# Patient Record
Sex: Male | Born: 1960
Health system: Southern US, Community
[De-identification: ages and names within clinical notes are randomized; demographics above are authoritative.]

## PROBLEM LIST (undated history)

## (undated) DIAGNOSIS — R011 Cardiac murmur, unspecified: Secondary | ICD-10-CM

## (undated) HISTORY — DX: Cardiac murmur, unspecified: R01.1

---

## 2002-10-07 HISTORY — PX: KIDNEY SURGERY: SHX687

## 2003-10-08 HISTORY — PX: AV FISTULA PLACEMENT: SHX1204

## 2003-10-08 HISTORY — PX: HEMORRHOID SURGERY: SHX153

## 2012-09-14 DIAGNOSIS — N509 Disorder of male genital organs, unspecified: Secondary | ICD-10-CM | POA: Insufficient documentation

## 2012-09-14 DIAGNOSIS — N400 Enlarged prostate without lower urinary tract symptoms: Secondary | ICD-10-CM | POA: Insufficient documentation

## 2012-09-14 DIAGNOSIS — N401 Enlarged prostate with lower urinary tract symptoms: Secondary | ICD-10-CM | POA: Insufficient documentation

## 2013-04-20 ENCOUNTER — Encounter: Payer: Self-pay | Admitting: *Deleted

## 2013-07-12 ENCOUNTER — Ambulatory Visit: Payer: Self-pay | Admitting: Gastroenterology

## 2014-01-21 ENCOUNTER — Ambulatory Visit: Payer: Self-pay | Admitting: Gastroenterology

## 2014-01-24 LAB — PATHOLOGY REPORT

## 2014-07-07 IMAGING — RF DG BARIUM SWALLOW
10 of 11 series · 15 of 18 positions shown · non-contrast
Comparison: none

REASON FOR EXAM: dysphagia
COMMENTS:

PROCEDURE:     FL  - FL BARIUM SWALLOW  - July 12, 2013 [DATE]
RESULT:     History: Dysphagia.
Comparison Study: No prior.

[Series 1: fluoro_barium 2fps_bw · 0.19mm/px · 3 of 12 frames shown (1 of 10)]
[frame 2/12]
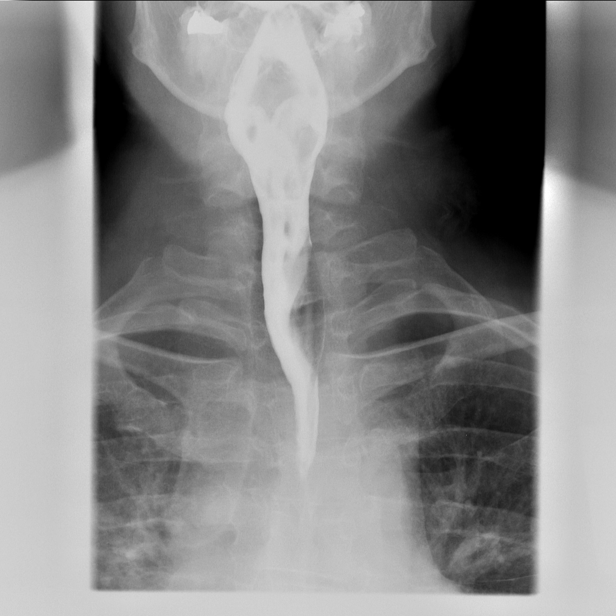
[frame 7/12]
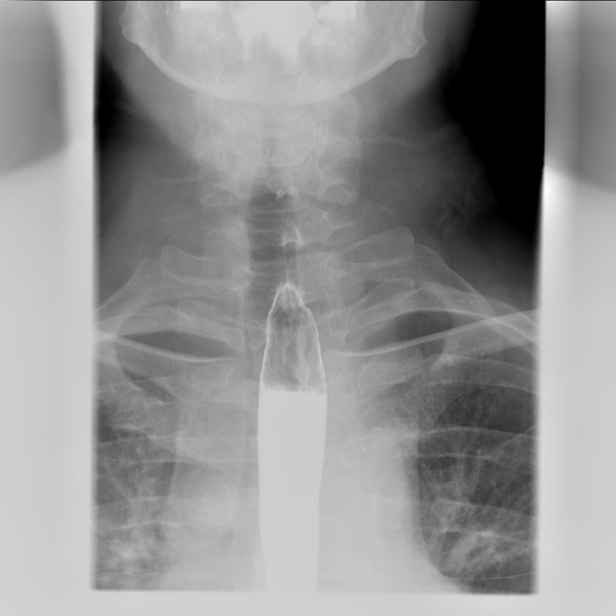
[frame 11/12]
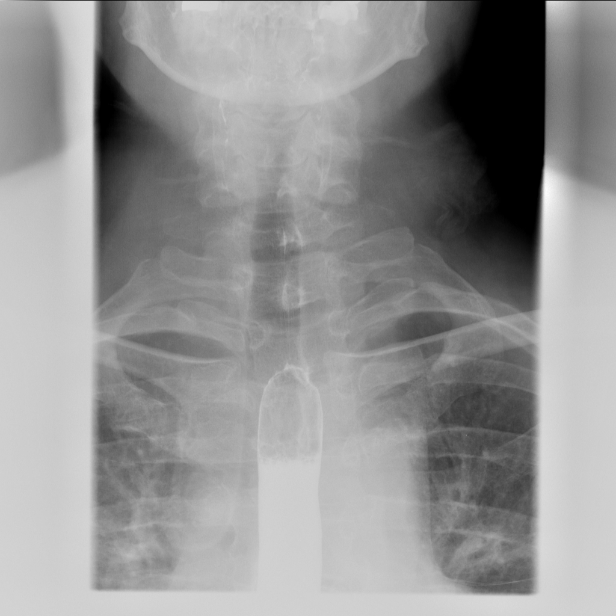

[Series 2: fluoro_barium 2fps_bw · 0.19mm/px · 3 of 7 frames shown (2 of 10)]
[frame 2/7]
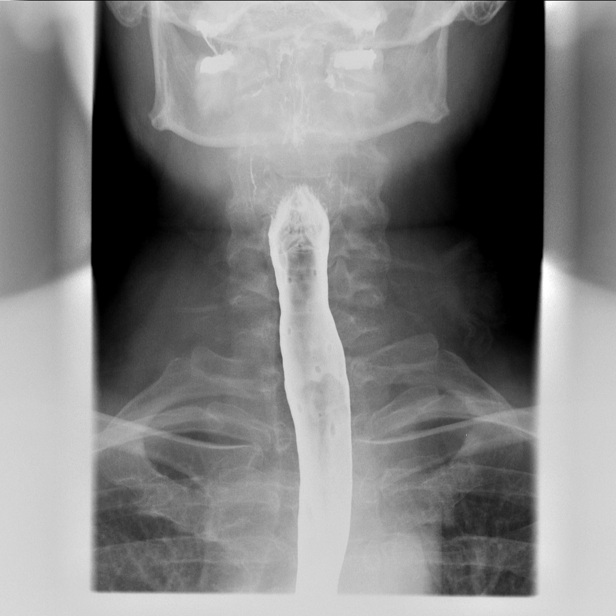
[frame 4/7]
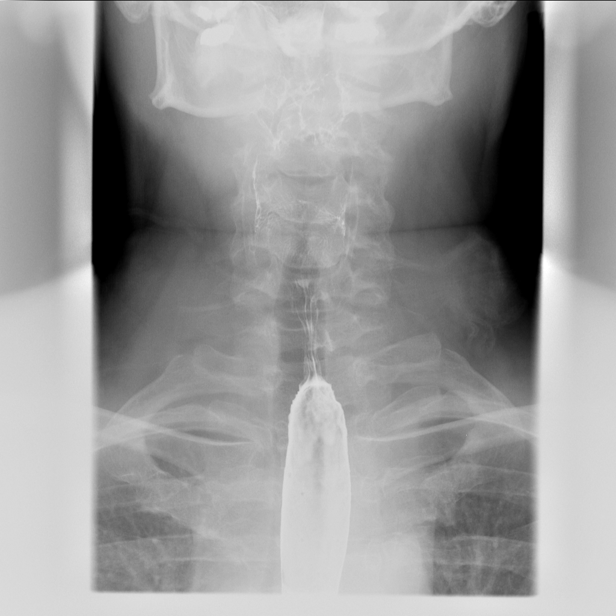
[frame 6/7]
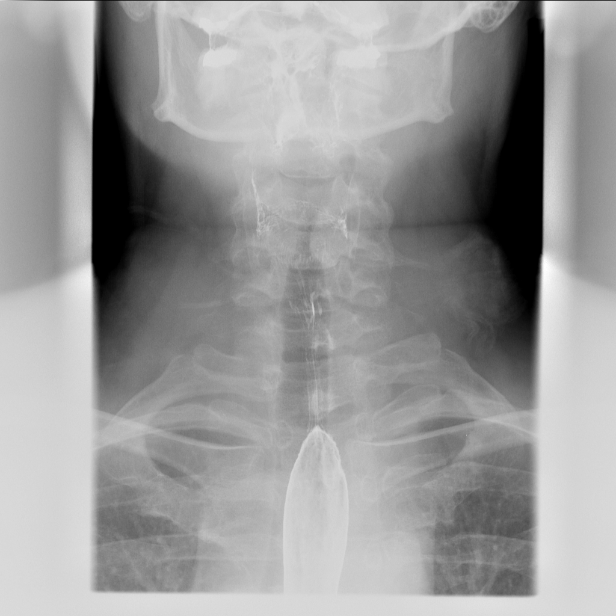

[Series 3: fluoro_barium 2fps_bw · 0.19mm/px · 1 of 1 slices shown (3 of 10)]
[im 1/1]
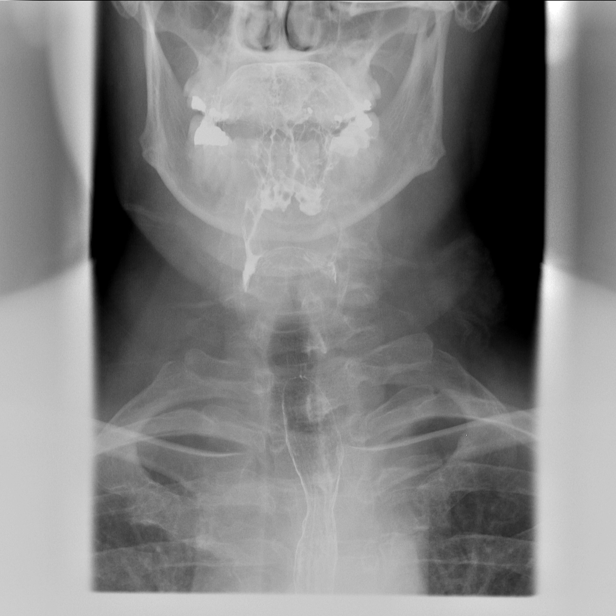

[Series 4: fluoro_barium 2fps_bw · 0.19mm/px · 2 of 8 frames shown (4 of 10)]
[frame 5/8]
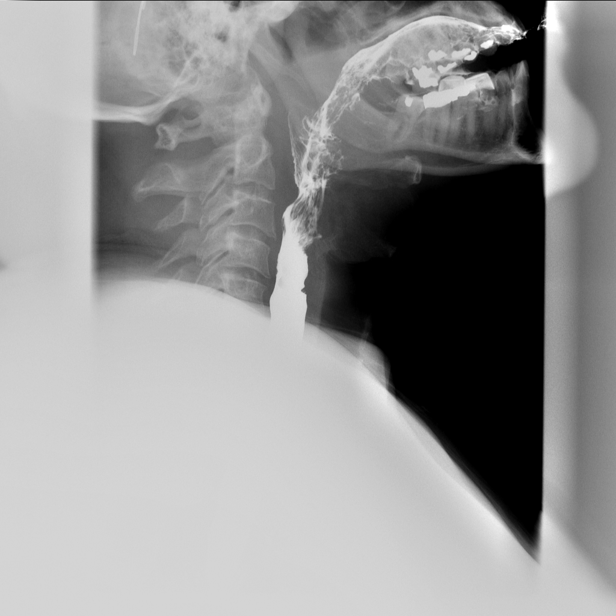
[frame 7/8]
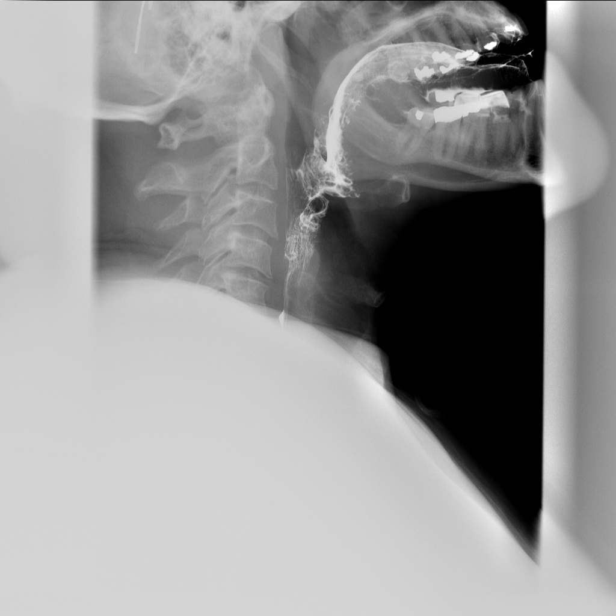

[Series 5: fluoro_barium 2fps_bw · 0.19mm/px · 1 of 1 slices shown (5 of 10)]
[im 1/1]
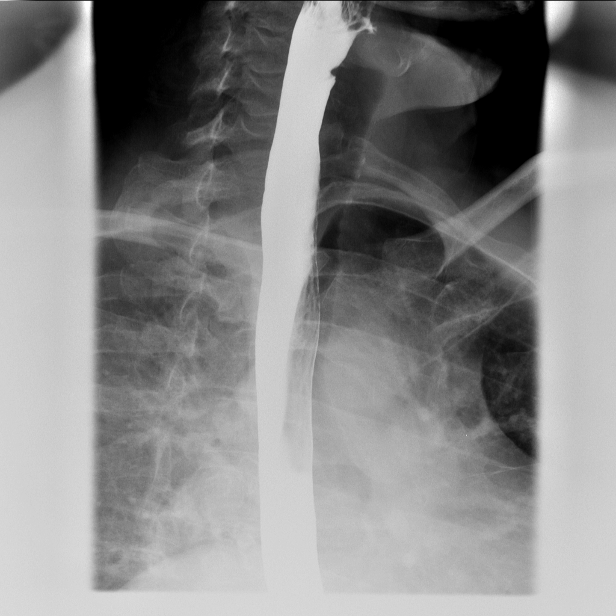

[Series 6: fluoro_barium 2fps_bw · 0.19mm/px · 1 of 1 slices shown (6 of 10)]
[im 1/1]
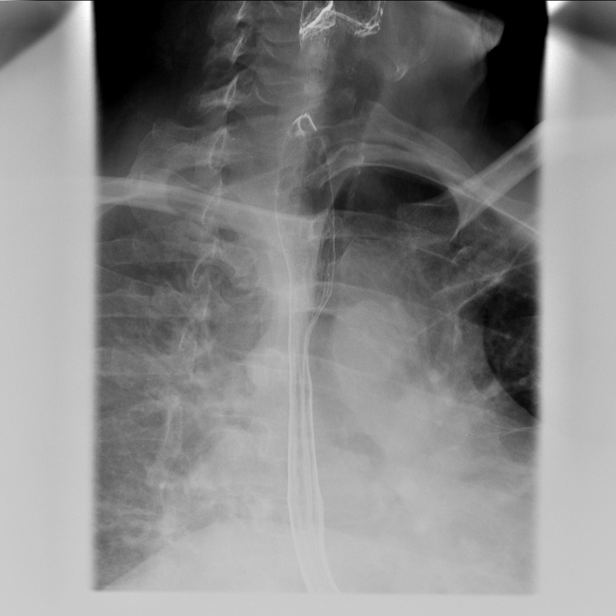

[Series 7: fluoro_barium 2fps_bw · 0.19mm/px · 1 of 1 slices shown (7 of 10)]
[im 1/1]
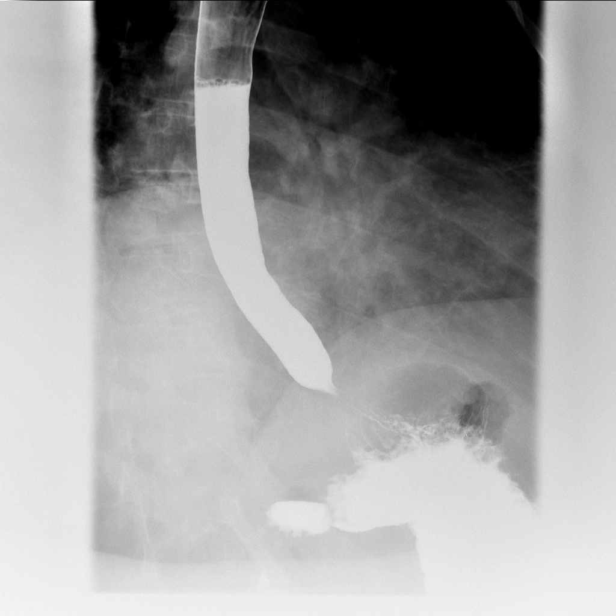

[Series 9: fluoro_barium 2fps_bw · 0.19mm/px · 1 of 1 slices shown (8 of 10)]
[im 1/1]
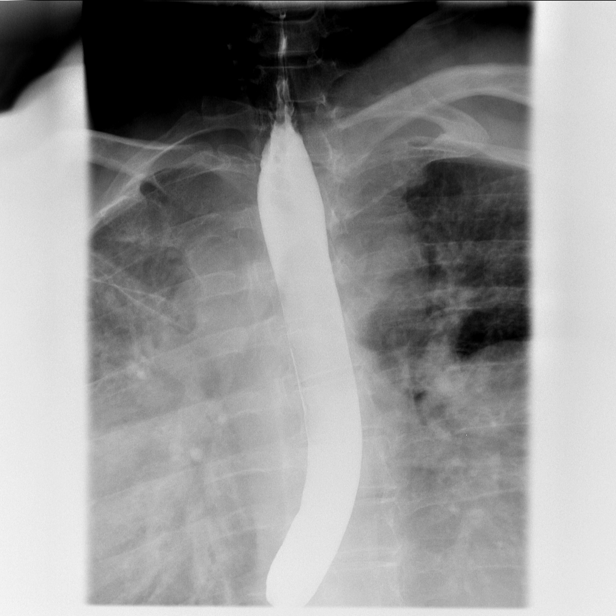

[Series 10: fluoro_barium 2fps_bw · 0.19mm/px · 1 of 1 slices shown (9 of 10)]
[im 1/1]
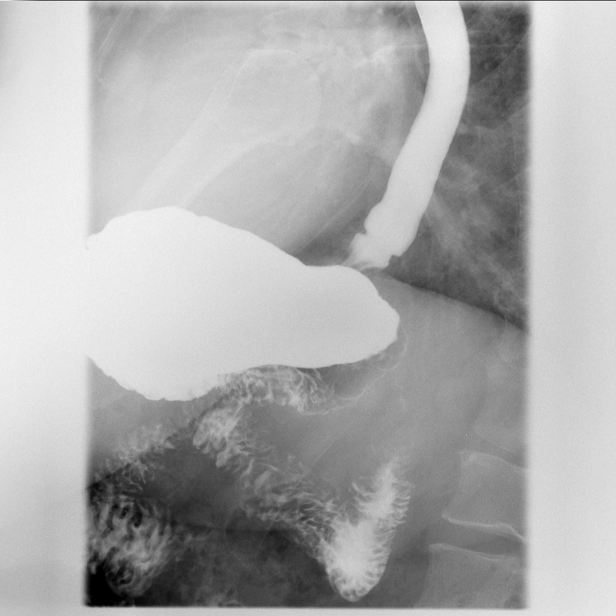

[Series 11: fluoro_barium 2fps_bw · 0.19mm/px · 1 of 1 slices shown (10 of 10)]
[im 1/1]
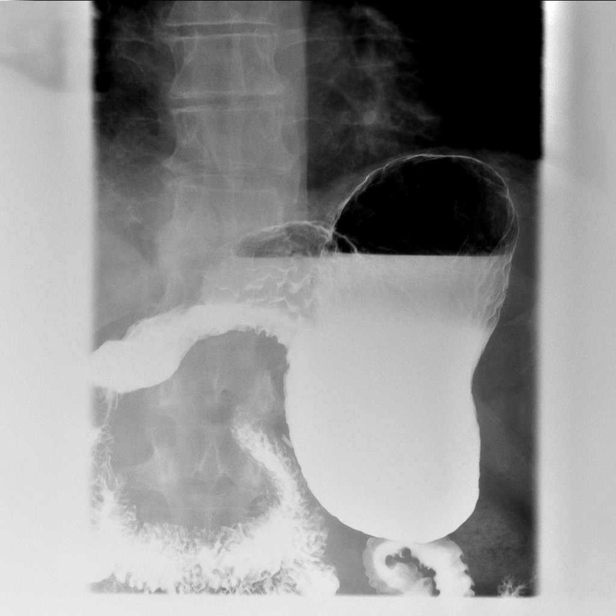

[15 of 18 positions shown; findings below may reference images not displayed]

FINDINGS: The cervical and esophagus is widely patent. Tiny hiatal hernia
with a small B ring is noted. There is no evidence of significant
stenosis.Standardized barium tablet passed into the stomach without
difficulty. No significant reflux.
IMPRESSION: Tiny hiatal hernia. Otherwise normal exam. No evidence of
significant stricture.

## 2015-01-31 ENCOUNTER — Ambulatory Visit: Payer: Self-pay | Admitting: General Surgery

## 2015-11-22 DIAGNOSIS — R972 Elevated prostate specific antigen [PSA]: Secondary | ICD-10-CM | POA: Insufficient documentation

## 2016-09-23 DIAGNOSIS — J329 Chronic sinusitis, unspecified: Secondary | ICD-10-CM | POA: Insufficient documentation

## 2016-09-23 DIAGNOSIS — J069 Acute upper respiratory infection, unspecified: Secondary | ICD-10-CM | POA: Insufficient documentation

## 2016-09-23 DIAGNOSIS — H6993 Unspecified Eustachian tube disorder, bilateral: Secondary | ICD-10-CM | POA: Insufficient documentation

## 2016-09-23 DIAGNOSIS — H6983 Other specified disorders of Eustachian tube, bilateral: Secondary | ICD-10-CM | POA: Insufficient documentation

## 2016-10-21 DIAGNOSIS — N289 Disorder of kidney and ureter, unspecified: Secondary | ICD-10-CM | POA: Diagnosis not present

## 2016-12-21 DIAGNOSIS — M25521 Pain in right elbow: Secondary | ICD-10-CM | POA: Diagnosis not present

## 2017-02-03 DIAGNOSIS — H6983 Other specified disorders of Eustachian tube, bilateral: Secondary | ICD-10-CM | POA: Diagnosis not present

## 2017-02-03 DIAGNOSIS — J329 Chronic sinusitis, unspecified: Secondary | ICD-10-CM | POA: Diagnosis not present

## 2017-02-11 DIAGNOSIS — J320 Chronic maxillary sinusitis: Secondary | ICD-10-CM | POA: Diagnosis not present

## 2017-02-11 DIAGNOSIS — J329 Chronic sinusitis, unspecified: Secondary | ICD-10-CM | POA: Diagnosis not present

## 2017-02-27 DIAGNOSIS — R972 Elevated prostate specific antigen [PSA]: Secondary | ICD-10-CM | POA: Diagnosis not present

## 2017-03-07 DIAGNOSIS — R972 Elevated prostate specific antigen [PSA]: Secondary | ICD-10-CM | POA: Diagnosis not present

## 2017-03-07 DIAGNOSIS — N4340 Spermatocele of epididymis, unspecified: Secondary | ICD-10-CM | POA: Diagnosis not present

## 2017-03-07 DIAGNOSIS — N289 Disorder of kidney and ureter, unspecified: Secondary | ICD-10-CM | POA: Diagnosis not present

## 2017-03-07 DIAGNOSIS — N401 Enlarged prostate with lower urinary tract symptoms: Secondary | ICD-10-CM | POA: Diagnosis not present

## 2017-03-12 DIAGNOSIS — R030 Elevated blood-pressure reading, without diagnosis of hypertension: Secondary | ICD-10-CM | POA: Diagnosis not present

## 2017-03-12 DIAGNOSIS — J324 Chronic pansinusitis: Secondary | ICD-10-CM | POA: Diagnosis not present

## 2017-03-12 DIAGNOSIS — N289 Disorder of kidney and ureter, unspecified: Secondary | ICD-10-CM | POA: Diagnosis not present

## 2017-03-12 DIAGNOSIS — Z0000 Encounter for general adult medical examination without abnormal findings: Secondary | ICD-10-CM | POA: Diagnosis not present

## 2017-04-03 DIAGNOSIS — J329 Chronic sinusitis, unspecified: Secondary | ICD-10-CM | POA: Diagnosis not present

## 2017-04-22 DIAGNOSIS — D2271 Melanocytic nevi of right lower limb, including hip: Secondary | ICD-10-CM | POA: Diagnosis not present

## 2017-04-22 DIAGNOSIS — L570 Actinic keratosis: Secondary | ICD-10-CM | POA: Diagnosis not present

## 2017-05-27 DIAGNOSIS — J329 Chronic sinusitis, unspecified: Secondary | ICD-10-CM | POA: Diagnosis not present

## 2017-07-28 DIAGNOSIS — Z23 Encounter for immunization: Secondary | ICD-10-CM | POA: Diagnosis not present

## 2017-09-01 DIAGNOSIS — R972 Elevated prostate specific antigen [PSA]: Secondary | ICD-10-CM | POA: Diagnosis not present

## 2017-09-25 DIAGNOSIS — N401 Enlarged prostate with lower urinary tract symptoms: Secondary | ICD-10-CM | POA: Diagnosis not present

## 2017-09-25 DIAGNOSIS — R972 Elevated prostate specific antigen [PSA]: Secondary | ICD-10-CM | POA: Diagnosis not present

## 2017-09-25 DIAGNOSIS — N4340 Spermatocele of epididymis, unspecified: Secondary | ICD-10-CM | POA: Diagnosis not present

## 2017-09-25 DIAGNOSIS — R339 Retention of urine, unspecified: Secondary | ICD-10-CM | POA: Diagnosis not present

## 2017-09-30 DIAGNOSIS — R339 Retention of urine, unspecified: Secondary | ICD-10-CM | POA: Insufficient documentation

## 2017-11-10 DIAGNOSIS — N289 Disorder of kidney and ureter, unspecified: Secondary | ICD-10-CM | POA: Diagnosis not present

## 2018-03-03 DIAGNOSIS — Z7689 Persons encountering health services in other specified circumstances: Secondary | ICD-10-CM | POA: Diagnosis not present

## 2018-03-13 DIAGNOSIS — Z0000 Encounter for general adult medical examination without abnormal findings: Secondary | ICD-10-CM | POA: Diagnosis not present

## 2018-03-19 ENCOUNTER — Ambulatory Visit (INDEPENDENT_AMBULATORY_CARE_PROVIDER_SITE_OTHER): Payer: 59 | Admitting: Urology

## 2018-03-19 ENCOUNTER — Encounter: Payer: Self-pay | Admitting: Urology

## 2018-03-19 VITALS — BP 131/84 | HR 82 | Resp 16 | Ht 67.0 in | Wt 245.0 lb

## 2018-03-19 DIAGNOSIS — N4340 Spermatocele of epididymis, unspecified: Secondary | ICD-10-CM | POA: Insufficient documentation

## 2018-03-19 DIAGNOSIS — Z87898 Personal history of other specified conditions: Secondary | ICD-10-CM | POA: Diagnosis not present

## 2018-03-19 NOTE — Progress Notes (Signed)
03/19/2018 1:33 PM   Joel Andrews 04/21/1961 [id number]  Referring provider: No referring provider defined for this encounter.  Chief Complaint  Patient presents with  . Elevated PSA    HPI: Joel Andrews is a 57 year old male who presents for transfer of urologic care to [facility].  He has previously seen Dr. Jacqlyn Larsen at [facility] and underwent a prostate biopsy in April 2017 for PSA of 5.6 with benign pathology.  His prostate volume was calculated at 38 cc.  He also had a prostate MRI prior to his biopsy that showed no suspicious lesions.  A PSA performed in late May 2019 at his PCP was 3.2.  He has no bothersome lower urinary tract symptoms.  He does have a right spermatocele and intermittent right hemiscrotal pain.  He denies dysuria or gross hematuria.  Denies flank or abdominal pain.   PMH: Past Medical History:  Diagnosis Date  . Heart murmur     Surgical History: Past Surgical History:  Procedure Laterality Date  . AV FISTULA PLACEMENT  2005  . [place]  2005  . KIDNEY SURGERY  2004    Home Medications:  Allergies as of 03/19/2018   No Known Allergies     Medication List        Accurate as of 03/19/18  1:33 PM. Always use your most recent med list.          Fish Oil 1000 MG Caps Take by mouth.   Flaxseed Oil 1000 MG Caps Take by mouth.   lisinopril 40 MG tablet Commonly known as:  PRINIVIL,ZESTRIL Take by mouth.   omeprazole 40 MG capsule Commonly known as:  PRILOSEC Take by mouth.   vitamin B-12 1000 MCG tablet Commonly known as:  CYANOCOBALAMIN Take by mouth.   VITAMIN D-1000 MAX ST 1000 units tablet Generic drug:  Cholecalciferol Take by mouth.   zinc gluconate 50 MG tablet Take by mouth.       Allergies: No Known Allergies  Family History: Family History  Problem Relation Age of Onset  . Prostate cancer Neg Hx   . Bladder Cancer Neg Hx   . Kidney cancer Neg Hx     Social History:  reports that he has never smoked. He has  never used smokeless tobacco. He reports that he does not drink alcohol or use drugs.  ROS: UROLOGY Frequent Urination?: No Hard to postpone urination?: No Burning/pain with urination?: No Get up at night to urinate?: Yes Leakage of urine?: Yes Urine stream starts and stops?: No Trouble starting stream?: No Do you have to strain to urinate?: No Blood in urine?: No Urinary tract infection?: No Sexually transmitted disease?: No Injury to kidneys or bladder?: No Painful intercourse?: No Weak stream?: No Erection problems?: No Penile pain?: No  Gastrointestinal Nausea?: No Vomiting?: No Indigestion/heartburn?: No Diarrhea?: No Constipation?: No  Constitutional Fever: No Night sweats?: No Weight loss?: No Fatigue?: No  Skin Skin rash/lesions?: No Itching?: No  Eyes Blurred vision?: No Double vision?: No  Ears/Nose/Throat Sore throat?: No Sinus problems?: No  Hematologic/Lymphatic Swollen glands?: No Easy bruising?: No  Cardiovascular Leg swelling?: No Chest pain?: No  Respiratory Cough?: No Shortness of breath?: No  Endocrine Excessive thirst?: No  Musculoskeletal Back pain?: No Joint pain?: No  Neurological Headaches?: No Dizziness?: No  Psychologic Depression?: No Anxiety?: No  Physical Exam: BP 131/84   Pulse 82   Resp 16   Ht 5\' 7"  (1.702 m)   Wt 245 lb (111.1 kg)  SpO2 96%   BMI 38.37 kg/m   Constitutional:  Alert and oriented, No acute distress. HEENT: [place] AT, moist mucus membranes.  Trachea midline, no masses. Cardiovascular: No clubbing, cyanosis, or edema. Respiratory: Normal respiratory effort, no increased work of breathing. GI: Abdomen is soft, nontender, nondistended, no abdominal masses GU: No CVA tenderness.  Penis without lesions.  Testes descended bilateral without masses or tenderness.  Approximately 15 mm spermatocele right globus major.  Prostate 35 g, smooth without nodules. Lymph: No cervical or inguinal  lymphadenopathy. Skin: No rashes, bruises or suspicious lesions. Neurologic: Grossly intact, no focal deficits, moving all 4 extremities. Psychiatric: Normal mood and affect.   Assessment & Plan:   57 year old male with history of elevated PSA and benign prostate biopsy.  A PSA performed in Dr. Reuel Boom office on 5/28 was stable at 3.2.  DRE is benign.  I have recommended a follow-up PSA only in 6 months and PSA/DRE in 1 year.   Return in about 1 year (around 03/20/2019) for Recheck.  Abbie Sons, [place] [address], [place], [place] [address] [phone]

## 2018-04-23 ENCOUNTER — Ambulatory Visit: Payer: Self-pay | Admitting: Urology

## 2018-04-23 DIAGNOSIS — D2262 Melanocytic nevi of left upper limb, including shoulder: Secondary | ICD-10-CM | POA: Diagnosis not present

## 2018-04-23 DIAGNOSIS — D225 Melanocytic nevi of trunk: Secondary | ICD-10-CM | POA: Diagnosis not present

## 2018-04-23 DIAGNOSIS — D2261 Melanocytic nevi of right upper limb, including shoulder: Secondary | ICD-10-CM | POA: Diagnosis not present

## 2018-06-03 DIAGNOSIS — M7702 Medial epicondylitis, left elbow: Secondary | ICD-10-CM | POA: Diagnosis not present

## 2018-09-10 ENCOUNTER — Other Ambulatory Visit: Payer: 59

## 2018-09-14 ENCOUNTER — Other Ambulatory Visit: Payer: 59

## 2018-09-17 ENCOUNTER — Ambulatory Visit: Payer: 59 | Admitting: Urology

## 2018-09-18 ENCOUNTER — Other Ambulatory Visit: Payer: Self-pay | Admitting: Family Medicine

## 2018-09-18 ENCOUNTER — Other Ambulatory Visit: Payer: 59

## 2018-09-18 ENCOUNTER — Ambulatory Visit: Payer: 59 | Admitting: Urology

## 2018-09-18 DIAGNOSIS — Z87898 Personal history of other specified conditions: Secondary | ICD-10-CM | POA: Diagnosis not present

## 2018-09-19 LAB — PSA: Prostate Specific Ag, Serum: 3.4 ng/mL (ref 0.0–4.0)

## 2018-09-21 ENCOUNTER — Ambulatory Visit (INDEPENDENT_AMBULATORY_CARE_PROVIDER_SITE_OTHER): Payer: 59 | Admitting: Urology

## 2018-09-21 ENCOUNTER — Encounter: Payer: Self-pay | Admitting: Urology

## 2018-09-21 VITALS — BP 145/89 | HR 63 | Ht 67.0 in | Wt 250.3 lb

## 2018-09-21 DIAGNOSIS — Z87898 Personal history of other specified conditions: Secondary | ICD-10-CM

## 2018-09-21 NOTE — Progress Notes (Signed)
09/21/2018  8:27 AM   Joel Andrews July 06, 1961 [id number]  Referring provider: Juluis Pitch, MD [phone] [address] [place], [place] [address]  Chief Complaint  Patient presents with  . Follow-up  . Elevated PSA   Urologic History 1. Elevated PSA   -Prostate Biopsy (01/2016); PSA 5.6; 38 cc prostate volume; benign  - MRI (06/04/2016) showed no suspicious lesions   HPI: Joel Andrews is a 57 y.o. male that present today for his 6 month PSA follow-up. Previously seen by Dr. Jacqlyn Larsen at [facility].   - Denies bothersome lower urinary tract symptoms  - Denies dysuria or gross hematuria.  - Denies flank or abdominal pain.  - Recent PSA: 3.4 (09/18/2018)  PMH: Past Medical History:  Diagnosis Date  . Heart murmur    Surgical History: Past Surgical History:  Procedure Laterality Date  . AV FISTULA PLACEMENT  2005  . [place]  2005  . KIDNEY SURGERY  2004   Home Medications:  Allergies as of 09/21/2018   No Known Allergies     Medication List       Accurate as of September 21, 2018  8:27 AM. Always use your most recent med list.        Fish Oil 1000 MG Caps Take by mouth.   Flaxseed Oil 1000 MG Caps Take by mouth.   lisinopril 40 MG tablet Commonly known as:  PRINIVIL,ZESTRIL Take by mouth.   omeprazole 40 MG capsule Commonly known as:  PRILOSEC Take by mouth.   vitamin B-12 1000 MCG tablet Commonly known as:  CYANOCOBALAMIN Take by mouth.   VITAMIN D-1000 MAX ST 25 MCG (1000 UT) tablet Generic drug:  Cholecalciferol Take by mouth.   zinc gluconate 50 MG tablet Take by mouth.      Allergies: No Known Allergies  Family History: Family History  Problem Relation Age of Onset  . Prostate cancer Neg Hx   . Bladder Cancer Neg Hx   . Kidney cancer Neg Hx    Social History:  reports that he has never smoked. He has never used smokeless tobacco. He reports that he does not drink alcohol or use drugs.  ROS: UROLOGY Frequent Urination?: No Hard to postpone  urination?: No Burning/pain with urination?: No Get up at night to urinate?: No Leakage of urine?: No Urine stream starts and stops?: No Trouble starting stream?: No Do you have to strain to urinate?: No Blood in urine?: No Urinary tract infection?: No Sexually transmitted disease?: No Injury to kidneys or bladder?: No Painful intercourse?: No Weak stream?: No Erection problems?: No Penile pain?: No  Gastrointestinal Nausea?: No Vomiting?: No Indigestion/heartburn?: No Diarrhea?: No Constipation?: No  Constitutional Fever: No Night sweats?: No Weight loss?: No Fatigue?: No  Skin Skin rash/lesions?: No Itching?: No  Eyes Blurred vision?: No Double vision?: No  Ears/Nose/Throat Sore throat?: No Sinus problems?: No  Hematologic/Lymphatic Swollen glands?: No Easy bruising?: No  Cardiovascular Leg swelling?: No Chest pain?: No  Respiratory Cough?: No Shortness of breath?: No  Endocrine Excessive thirst?: No  Musculoskeletal Back pain?: No Joint pain?: No  Neurological Headaches?: No Dizziness?: No  Psychologic Depression?: No Anxiety?: No  Physical Exam: BP (!) 145/89 (BP Location: Left Arm, Patient Position: Sitting, Cuff Size: Large)   Pulse 63   Ht 5\' 7"  (1.702 m)   Wt 250 lb 4.8 oz (113.5 kg)   BMI 39.20 kg/m   Constitutional:  Alert and oriented, No acute distress. Respiratory: Normal respiratory effort, no increased work  of breathing. GU: No CVA tenderness Skin: No rashes, bruises or suspicious lesions. Neurologic: Grossly intact, no focal deficits, moving all 4 extremities. Psychiatric: Normal mood and affect.  Laboratory Results: PSA History 3.4 ng/mL on 08/08/2014 3.3 ng/ml on 08/08/2014 3.8 ng/ml on 02/20/2015 4.1 ng/ml on 05/31/2015 5.8 ng/ml on 05/07/2016 2.5 ng/ml on 03/07/2017 3.2 ng/ml on 03/03/2018 3.4 ng/ml on 09/18/2018  Assessment & Plan:    1. History of Elevated PSA  - PSA is stable (3.4; 09/18/2018)  -  Patient denies any lower urinary tract symptoms  - Follow up in 6 months for PSA/DRE   Abbie Sons, MD  [facility] [address] [place], [place] [address] [phone]  I, Temidayo Atanda-Ogunleye , am acting as a scribe for [employer], MD  I, Abbie Sons, MD, have reviewed all documentation for this visit. The documentation on 09/21/18 for the exam, diagnosis, procedures, and orders are all accurate and complete.

## 2018-11-17 ENCOUNTER — Telehealth: Payer: Self-pay | Admitting: Urology

## 2018-11-17 NOTE — Telephone Encounter (Signed)
Patient called and wanted his pharmacy updated [facility] on [address]. I corrected it in [place]

## 2018-12-15 DIAGNOSIS — R030 Elevated blood-pressure reading, without diagnosis of hypertension: Secondary | ICD-10-CM | POA: Insufficient documentation

## 2018-12-15 DIAGNOSIS — N289 Disorder of kidney and ureter, unspecified: Secondary | ICD-10-CM | POA: Insufficient documentation

## 2018-12-16 ENCOUNTER — Other Ambulatory Visit: Payer: Self-pay

## 2018-12-16 ENCOUNTER — Encounter

## 2018-12-16 ENCOUNTER — Encounter: Payer: Self-pay | Admitting: Podiatry

## 2018-12-16 ENCOUNTER — Ambulatory Visit (INDEPENDENT_AMBULATORY_CARE_PROVIDER_SITE_OTHER): Payer: Managed Care, Other (non HMO)

## 2018-12-16 ENCOUNTER — Ambulatory Visit: Payer: Managed Care, Other (non HMO) | Admitting: Podiatry

## 2018-12-16 VITALS — BP 125/81 | HR 79 | Resp 16

## 2018-12-16 DIAGNOSIS — M779 Enthesopathy, unspecified: Secondary | ICD-10-CM | POA: Diagnosis not present

## 2018-12-16 DIAGNOSIS — M778 Other enthesopathies, not elsewhere classified: Secondary | ICD-10-CM

## 2018-12-16 DIAGNOSIS — M722 Plantar fascial fibromatosis: Secondary | ICD-10-CM | POA: Diagnosis not present

## 2018-12-16 MED ORDER — METHYLPREDNISOLONE 4 MG PO TBPK: ORAL_TABLET | ORAL | 0 refills | Status: DC

## 2018-12-16 MED ORDER — MELOXICAM 15 MG PO TABS: 15.0000 mg | ORAL_TABLET | Freq: Every day | ORAL | 3 refills | Status: DC

## 2018-12-16 NOTE — Patient Instructions (Signed)

## 2018-12-16 NOTE — Progress Notes (Signed)
Subjective:  Patient ID: Joel Andrews, male    DOB: 09-15-1961,  MRN: [id number] HPI Chief Complaint  Patient presents with  . Foot Pain    Plantar heel left - aching x 1 month, sharp at times, AM oain, tried Ibuprofen-temp relief  . New Patient (Initial Visit)    58 y.o. male presents with the above complaint.   ROS: Denies fever chills nausea vomiting muscle aches pains calf pain back pain chest pain shortness of breath.  Past Medical History:  Diagnosis Date  . Heart murmur    Past Surgical History:  Procedure Laterality Date  . AV FISTULA PLACEMENT  2005  . [place]  2005  . KIDNEY SURGERY  2004    Current Outpatient Medications:  .  Cholecalciferol (VITAMIN D-1000  ST) 1000 units tablet, Take by mouth., Disp: , Rfl:  .  Flaxseed, Linseed, (FLAXSEED OIL) 1000 MG CAPS, Take by mouth., Disp: , Rfl:  .  ibuprofen (ADVIL,MOTRIN) 800 MG tablet, TK 1 T PO TID WF, Disp: , Rfl:  .  lisinopril (PRINIVIL,ZESTRIL) 40 MG tablet, Take by mouth., Disp: , Rfl:  .  meloxicam (MOBIC) 15 MG tablet, Take 1 tablet (15 mg total) by mouth daily., Disp: 30 tablet, Rfl: 3 .  methylPREDNISolone (MEDROL DOSEPAK) 4 MG TBPK tablet, 6 day dose pack - take as directed, Disp: 21 tablet, Rfl: 0 .  Omega-3 Fatty Acids (FISH OIL) 1000 MG CAPS, Take by mouth., Disp: , Rfl:  .  omeprazole (PRILOSEC) 40 MG capsule, Take by mouth., Disp: , Rfl:  .  vitamin B-12 (CYANOCOBALAMIN) 1000 MCG tablet, Take by mouth., Disp: , Rfl:  .  zinc gluconate 50 MG tablet, Take by mouth., Disp: , Rfl:   No Known Allergies Review of Systems Objective:   Vitals:   12/16/18 1333  BP: 125/81  Pulse: 79  Resp: 16    General: Well developed, nourished, in no acute distress, alert and oriented x3   Dermatological: Skin is warm, dry and supple bilateral. Nails x 10 are well maintained; remaining integument appears unremarkable at this time. There are no open sores, no preulcerative lesions, no rash or signs of  infection present.  Vascular: Dorsalis Pedis artery and Posterior Tibial artery pedal pulses are 2/4 bilateral with immedate capillary fill time. Pedal hair growth present. No varicosities and no lower extremity edema present bilateral.   Neruologic: Grossly intact via light touch bilateral. Vibratory intact via tuning fork bilateral. Protective threshold with Semmes Wienstein monofilament intact to all pedal sites bilateral. Patellar and Achilles deep tendon reflexes 2+ bilateral. No Babinski or clonus noted bilateral.   Musculoskeletal: No gross boney pedal deformities bilateral. No pain, crepitus, or limitation noted with foot and ankle range of motion bilateral. Muscular strength 5/5 in all groups tested bilateral.  Gait: Unassisted, Nonantalgic.    Radiographs:  Radiographs demonstrate soft tissue increase in density plantar fashion calcaneal insertion site of the left heel.  Assessment & Plan:   Assessment: Plantar fasciitis left.  Plan: Discussed etiology pathology conservative versus surgical therapies.  At this point after sterile Betadine skin prep I injected the left heel today with 20 mg Kenalog 5 mg Marcaine point maximal tenderness left.  Discussed the etiology pathology conservative surgical therapies discussed appropriate shoe gear stretching exercises ice therapy and shoe gear modifications.  Start him on a Medrol Dosepak to be followed by meloxicam.  Placed him in a plantar fascial brace and a single night splint.  I will follow-up with him  in 1 month.      T. [place] Acres, [place]

## 2019-01-13 ENCOUNTER — Ambulatory Visit: Payer: Managed Care, Other (non HMO) | Admitting: Podiatry

## 2019-01-27 ENCOUNTER — Ambulatory Visit: Payer: Managed Care, Other (non HMO) | Admitting: Podiatry

## 2019-01-28 ENCOUNTER — Ambulatory Visit: Payer: Managed Care, Other (non HMO) | Admitting: General Surgery

## 2019-03-18 ENCOUNTER — Other Ambulatory Visit: Payer: Self-pay | Admitting: Family Medicine

## 2019-03-18 DIAGNOSIS — Z87898 Personal history of other specified conditions: Secondary | ICD-10-CM

## 2019-03-19 ENCOUNTER — Other Ambulatory Visit: Payer: 59

## 2019-03-19 ENCOUNTER — Ambulatory Visit: Payer: 59 | Admitting: Urology

## 2019-03-19 ENCOUNTER — Other Ambulatory Visit: Payer: Managed Care, Other (non HMO)

## 2019-03-19 ENCOUNTER — Other Ambulatory Visit: Payer: Self-pay

## 2019-03-19 DIAGNOSIS — Z87898 Personal history of other specified conditions: Secondary | ICD-10-CM

## 2019-03-20 LAB — PSA: Prostate Specific Ag, Serum: 3.8 ng/mL (ref 0.0–4.0)

## 2019-03-23 ENCOUNTER — Ambulatory Visit: Payer: Managed Care, Other (non HMO) | Admitting: Urology

## 2019-03-23 ENCOUNTER — Encounter: Payer: Self-pay | Admitting: Urology

## 2019-03-23 ENCOUNTER — Other Ambulatory Visit: Payer: Self-pay

## 2019-03-23 VITALS — BP 123/75 | HR 67 | Ht 67.0 in | Wt 240.1 lb

## 2019-03-23 DIAGNOSIS — N401 Enlarged prostate with lower urinary tract symptoms: Secondary | ICD-10-CM | POA: Diagnosis not present

## 2019-03-23 DIAGNOSIS — Z87898 Personal history of other specified conditions: Secondary | ICD-10-CM

## 2019-03-23 NOTE — Progress Notes (Signed)
03/23/2019 2:17 PM   Joel Andrews 09-08-1961 [id number]  Referring provider: Juluis Pitch, MD [phone] [place],  [place] [address]  Chief Complaint  Patient presents with  . Elevated PSA     Urologic History 1. Elevated PSA                       -Prostate Biopsy (01/2016); PSA 5.6; 38 cc prostate volume; benign             - MRI (06/04/2016) showed no suspicious lesions  HPI: 58 year old male presents for follow-up of an elevated PSA.  He continues to do well and denies bothersome lower urinary tract symptoms. Denies dysuria, gross hematuria or flank/abdominal/pelvic/scrotal pain.  PSA drawn 03/19/2019 was 3.8.  PSA History 3.4 ng/mL on 08/08/2014 3.3 ng/ml on 08/08/2014 3.8 ng/ml on 02/20/2015 4.1 ng/ml on 05/31/2015 5.8 ng/ml on 05/07/2016 2.5 ng/ml on 03/07/2017 3.2 ng/ml on 03/03/2018 3.4 ng/ml on 09/18/2018 3.8 ng/ml on 03/19/2019  PMH: Past Medical History:  Diagnosis Date  . Heart murmur     Surgical History: Past Surgical History:  Procedure Laterality Date  . AV FISTULA PLACEMENT  2005  . [place]  2005  . KIDNEY SURGERY  2004    Home Medications:  Allergies as of 03/23/2019   No Known Allergies     Medication List       Accurate as of March 23, 2019  2:17 PM. If you have any questions, ask your nurse or doctor.        STOP taking these medications   methylPREDNISolone 4 MG Tbpk tablet Commonly known as: MEDROL DOSEPAK Stopped by: Abbie Sons, MD     TAKE these medications   cetirizine 10 MG tablet Commonly known as: ZYRTEC Take by mouth.   Fish Oil 1000 MG Caps Take by mouth.   Flaxseed Oil 1000 MG Caps Take by mouth.   ibuprofen 800 MG tablet Commonly known as: ADVIL TK 1 T PO TID WF   lisinopril 40 MG tablet Commonly known as: ZESTRIL Take by mouth.   meloxicam 15 MG tablet Commonly known as: MOBIC Take 1 tablet (15 mg total) by mouth daily.   Multi-Vitamin tablet Take by mouth.   omeprazole  40 MG capsule Commonly known as: PRILOSEC Take by mouth.   vitamin B-12 1000 MCG tablet Commonly known as: CYANOCOBALAMIN Take by mouth.   Vitamin D-1000 Max St 25 MCG (1000 UT) tablet Generic drug: Cholecalciferol Take by mouth.   zinc gluconate 50 MG tablet Take by mouth.       Allergies: No Known Allergies  Family History: Family History  Problem Relation Age of Onset  . Prostate cancer Neg Hx   . Bladder Cancer Neg Hx   . Kidney cancer Neg Hx     Social History:  reports that he has never smoked. He has never used smokeless tobacco. He reports that he does not drink alcohol or use drugs.  ROS: UROLOGY Frequent Urination?: No Hard to postpone urination?: No Burning/pain with urination?: No Get up at night to urinate?: Yes Leakage of urine?: No Urine stream starts and stops?: No Trouble starting stream?: No Do you have to strain to urinate?: No Blood in urine?: No Urinary tract infection?: No Sexually transmitted disease?: No Injury to kidneys or bladder?: No Painful intercourse?: No Weak stream?: No Erection problems?: No Penile pain?: No  Gastrointestinal Nausea?: No Vomiting?: No Indigestion/heartburn?: No Diarrhea?: No Constipation?: No  Constitutional  Fever: No Night sweats?: No Weight loss?: No Fatigue?: No  Skin Skin rash/lesions?: No Itching?: No  Eyes Blurred vision?: No Double vision?: No  Ears/Nose/Throat Sore throat?: No Sinus problems?: No  Hematologic/Lymphatic Swollen glands?: No Easy bruising?: No  Cardiovascular Leg swelling?: No Chest pain?: No  Respiratory Cough?: No Shortness of breath?: No  Endocrine Excessive thirst?: No  Musculoskeletal Back pain?: No Joint pain?: No  Neurological Headaches?: No Dizziness?: No  Psychologic Depression?: No Anxiety?: No  Physical Exam: BP 123/75 (BP Location: Left Arm, Patient Position: Sitting, Cuff Size: Normal)   Pulse 67   Ht 5\' 7"  (1.702 m)   Wt 240 lb  1.6 oz (108.9 kg)   BMI 37.60 kg/m   Constitutional:  Alert and oriented, No acute distress. HEENT: [place] AT, moist mucus membranes.  Trachea midline, no masses. Cardiovascular: No clubbing, cyanosis, or edema. Respiratory: Normal respiratory effort, no increased work of breathing. GI: Abdomen is soft, nontender, nondistended, no abdominal masses GU: No CVA tenderness.  Prostate 35 g, smooth without nodules Lymph: No cervical or inguinal lymphadenopathy. Skin: No rashes, bruises or suspicious lesions. Neurologic: Grossly intact, no focal deficits, moving all 4 extremities. Psychiatric: Normal mood and affect.   Assessment & Plan:   58 year old male with a history of an elevated PSA with benign prostate biopsy.  PSA remains stable.  Follow-up 6 months for PSA only and 1 year for PSA/DRE.  If things stable over the next year we will move to annual PSA/DRE.  Abbie Sons, [facility] [address], [place], [place] [address] [phone]

## 2019-03-24 ENCOUNTER — Ambulatory Visit: Payer: 59 | Admitting: Urology

## 2019-03-30 ENCOUNTER — Telehealth: Payer: Self-pay

## 2019-03-30 MED ORDER — MELOXICAM 15 MG PO TABS: 15.0000 mg | ORAL_TABLET | Freq: Every day | ORAL | 3 refills | Status: DC

## 2019-03-30 NOTE — Telephone Encounter (Signed)
Per Dr. Stephenie Acres verbal order, ok to refill Meloxicam     Script has been sent to pharmacy

## 2019-03-30 NOTE — Telephone Encounter (Signed)
-----   Message from Arlyce Dice sent at 03/30/2019 10:23 AM EDT ----- Patient called in requesting refill of medication "Meloxicam" to be sent to [facility].

## 2019-05-12 ENCOUNTER — Other Ambulatory Visit: Payer: Self-pay

## 2019-05-12 ENCOUNTER — Ambulatory Visit: Payer: Managed Care, Other (non HMO) | Admitting: Podiatry

## 2019-05-12 ENCOUNTER — Encounter: Payer: Self-pay | Admitting: Podiatry

## 2019-05-12 VITALS — Temp 98.8°F

## 2019-05-12 DIAGNOSIS — M722 Plantar fascial fibromatosis: Secondary | ICD-10-CM | POA: Diagnosis not present

## 2019-05-12 MED ORDER — METHYLPREDNISOLONE 4 MG PO TBPK: ORAL_TABLET | ORAL | 0 refills | Status: DC

## 2019-05-12 MED ORDER — MELOXICAM 15 MG PO TABS: 15.0000 mg | ORAL_TABLET | Freq: Every day | ORAL | 3 refills | Status: DC

## 2019-05-12 NOTE — Addendum Note (Signed)
Addended by: Rip Harbour on: 05/12/2019 03:13 PM   Modules accepted: Orders

## 2019-05-12 NOTE — Progress Notes (Signed)
He presents today states him having a new flareup by about 3 weeks left heel.  Objective: Vital signs are stable alert and oriented x3.  Pulses are palpable.  He has pain on palpation medial calcaneal tubercle of his left heel.  Assessment: Plantar fasciitis left heel.  Plan: Reinjected left heel with 20 mg Kenalog restarted him on methylprednisolone to be followed by meloxicam.  He continued his all of his other conservative therapies follow-up with him in 1 month if not improved orthotics will be necessary.

## 2019-09-01 ENCOUNTER — Other Ambulatory Visit: Payer: Self-pay

## 2019-09-01 ENCOUNTER — Other Ambulatory Visit: Payer: Self-pay | Admitting: Podiatry

## 2019-09-01 MED ORDER — MELOXICAM 15 MG PO TABS: 15.0000 mg | ORAL_TABLET | Freq: Every day | ORAL | 4 refills | Status: DC

## 2019-09-01 NOTE — Telephone Encounter (Signed)
Pharmacy refill request for Meloxicam 15mg    Per Dr. Stephenie Acres verbal order, ok to refill   Script has been sent to pharmacy

## 2019-09-21 ENCOUNTER — Other Ambulatory Visit: Payer: Self-pay | Admitting: Family Medicine

## 2019-09-21 DIAGNOSIS — N401 Enlarged prostate with lower urinary tract symptoms: Secondary | ICD-10-CM

## 2019-09-21 DIAGNOSIS — Z87898 Personal history of other specified conditions: Secondary | ICD-10-CM

## 2019-09-22 ENCOUNTER — Other Ambulatory Visit: Payer: Self-pay

## 2019-09-22 ENCOUNTER — Other Ambulatory Visit: Payer: Managed Care, Other (non HMO)

## 2019-09-22 DIAGNOSIS — Z87898 Personal history of other specified conditions: Secondary | ICD-10-CM

## 2019-09-22 DIAGNOSIS — N401 Enlarged prostate with lower urinary tract symptoms: Secondary | ICD-10-CM

## 2019-09-23 ENCOUNTER — Telehealth: Payer: Self-pay

## 2019-09-23 LAB — PSA: Prostate Specific Ag, Serum: 3.5 ng/mL (ref 0.0–4.0)

## 2019-09-23 NOTE — Telephone Encounter (Signed)
-----   Message from Abbie Sons, MD sent at 09/23/2019  7:23 AM EST ----- PSA stable 3.5

## 2019-09-23 NOTE — Telephone Encounter (Signed)
Called pt informed him of the information below. Pt gave verbal understanding.  

## 2019-10-03 ENCOUNTER — Other Ambulatory Visit: Payer: Self-pay | Admitting: Podiatry

## 2020-01-26 ENCOUNTER — Encounter: Payer: Self-pay | Admitting: Podiatry

## 2020-01-26 ENCOUNTER — Other Ambulatory Visit: Payer: Self-pay

## 2020-01-26 ENCOUNTER — Ambulatory Visit: Payer: Managed Care, Other (non HMO) | Admitting: Podiatry

## 2020-01-26 DIAGNOSIS — M778 Other enthesopathies, not elsewhere classified: Secondary | ICD-10-CM | POA: Diagnosis not present

## 2020-01-26 DIAGNOSIS — M722 Plantar fascial fibromatosis: Secondary | ICD-10-CM | POA: Diagnosis not present

## 2020-01-26 MED ORDER — METHYLPREDNISOLONE 4 MG PO TBPK: ORAL_TABLET | ORAL | 0 refills | Status: DC

## 2020-01-26 MED ORDER — MELOXICAM 15 MG PO TABS: 15.0000 mg | ORAL_TABLET | Freq: Every day | ORAL | 3 refills | Status: DC

## 2020-01-26 NOTE — Progress Notes (Signed)
He presents today for follow-up of his left heel.  States there is pain in the left heel and on the outside of the foot.  Objective: Vital signs are stable he is alert and oriented x3.  Pulses are palpable.  He has pain on palpation medial calcaneal tubercle of the right heel and again at the level of the fifth metatarsal phalangeal joint.  Assessment: Plan fasciitis medial left associated with lateral compensatory syndrome and capsulitis fifth metatarsophalangeal joint left foot.  Plan: Discussed etiology pathology and surgical therapies after sterile Betadine skin prep I injected the left heel 20 mg Kenalog 5 mg Marcaine point of maximal tenderness.  I also injected the fifth metatarsophalangeal joint area.

## 2020-03-13 ENCOUNTER — Ambulatory Visit: Payer: Managed Care, Other (non HMO) | Admitting: Podiatry

## 2020-03-17 ENCOUNTER — Other Ambulatory Visit: Payer: Self-pay | Admitting: Family Medicine

## 2020-03-17 DIAGNOSIS — Z87898 Personal history of other specified conditions: Secondary | ICD-10-CM

## 2020-03-20 ENCOUNTER — Other Ambulatory Visit: Payer: Self-pay | Admitting: Podiatry

## 2020-03-20 ENCOUNTER — Other Ambulatory Visit: Payer: Self-pay

## 2020-03-20 ENCOUNTER — Other Ambulatory Visit: Payer: Managed Care, Other (non HMO)

## 2020-03-20 DIAGNOSIS — Z87898 Personal history of other specified conditions: Secondary | ICD-10-CM

## 2020-03-21 LAB — PSA: Prostate Specific Ag, Serum: 3.5 ng/mL (ref 0.0–4.0)

## 2020-03-23 ENCOUNTER — Encounter: Payer: Self-pay | Admitting: Urology

## 2020-03-23 ENCOUNTER — Ambulatory Visit: Payer: Managed Care, Other (non HMO) | Admitting: Urology

## 2020-03-23 ENCOUNTER — Other Ambulatory Visit: Payer: Self-pay

## 2020-03-23 VITALS — BP 160/91 | HR 73 | Ht 67.0 in | Wt 241.0 lb

## 2020-03-23 DIAGNOSIS — N400 Enlarged prostate without lower urinary tract symptoms: Secondary | ICD-10-CM

## 2020-03-23 DIAGNOSIS — Z87898 Personal history of other specified conditions: Secondary | ICD-10-CM | POA: Diagnosis not present

## 2020-03-23 NOTE — Progress Notes (Signed)
03/23/2020 9:35 AM   Joel Andrews 14-Sep-1961 [id number]  Referring provider: Juluis Pitch, MD [phone],  [place] [address]  Chief Complaint  Patient presents with  . Elevated PSA    Urologic History 1. Elevated PSA - Prostate Biopsy (01/2016); PSA 5.6; 38 cc prostate volume; benign - MRI (06/04/2016) showed no suspicious lesions   HPI: 59 y.o. male presents for annual follow-up.  -No complaints since visit last year -No bothersome LUTS -Denies dysuria, gross hematuria -Denies flank, abdominal, pelvic pain -PSA 03/20/2020 stable at 3.5     PMH: Past Medical History:  Diagnosis Date  . Heart murmur     Surgical History: Past Surgical History:  Procedure Laterality Date  . AV FISTULA PLACEMENT  2005  . [place]  2005  . KIDNEY SURGERY  2004    Home Medications:  Allergies as of 03/23/2020   No Known Allergies     Medication List       Accurate as of March 23, 2020  9:35 AM. If you have any questions, ask your nurse or doctor.        cetirizine 10 MG tablet Commonly known as: ZYRTEC Take by mouth.   Fish Oil 1000 MG Caps Take by mouth.   Flaxseed Oil 1000 MG Caps Take by mouth.   ibuprofen 800 MG tablet Commonly known as: ADVIL TK 1 T PO TID WF   lisinopril 40 MG tablet Commonly known as: ZESTRIL Take by mouth.   meloxicam 15 MG tablet Commonly known as: MOBIC TAKE 1 TABLET(15 MG) BY MOUTH DAILY   methylPREDNISolone 4 MG Tbpk tablet Commonly known as: MEDROL DOSEPAK 6 day dose pack - take as directed   Multi-Vitamin tablet Take by mouth.   omeprazole 40 MG capsule Commonly known as: PRILOSEC Take by mouth.   vitamin B-12 1000 MCG tablet Commonly known as: CYANOCOBALAMIN Take by mouth.   Vitamin D-1000 Max St 25 MCG (1000 UT) tablet Generic drug: Cholecalciferol Take by mouth.   zinc gluconate 50 MG tablet Take by mouth.       Allergies: No Known Allergies  Family History: Family  History  Problem Relation Age of Onset  . Prostate cancer Neg Hx   . Bladder Cancer Neg Hx   . Kidney cancer Neg Hx     Social History:  reports that he has never smoked. He has never used smokeless tobacco. He reports that he does not drink alcohol and does not use drugs.   Physical Exam: BP (!) 160/91   Pulse 73   Ht 5\' 7"  (1.702 m)   Wt 241 lb (109.3 kg)   BMI 37.75 kg/m   Constitutional:  Alert and oriented, No acute distress. HEENT: [place] AT, moist mucus membranes.  Trachea midline, no masses. Cardiovascular: No clubbing, cyanosis, or edema. Respiratory: Normal respiratory effort, no increased work of breathing. GU: Prostate 35 g, smooth without nodules Skin: No rashes, bruises or suspicious lesions. Neurologic: Grossly intact, no focal deficits, moving all 4 extremities. Psychiatric: Normal mood and affect.    Assessment & Plan:    1. History of elevated PSA -Benign DRE and PSA remains stable -Recommend continuing annual PSA/DRE until age 30 -Happy to continue follow-up with me; we also discussed the option of annual follow-up with his PCP and referral back for any changes.  Will make a tentative follow-up here for next year.   Abbie Sons, [facility] [address] [place],  [place] [address] [phone]

## 2020-03-26 ENCOUNTER — Encounter: Payer: Self-pay | Admitting: Urology

## 2020-04-24 ENCOUNTER — Ambulatory Visit: Payer: Managed Care, Other (non HMO) | Admitting: Podiatry

## 2020-06-05 ENCOUNTER — Ambulatory Visit: Payer: Managed Care, Other (non HMO) | Admitting: Podiatry

## 2020-07-12 ENCOUNTER — Ambulatory Visit: Payer: Managed Care, Other (non HMO) | Admitting: Podiatry

## 2020-08-23 ENCOUNTER — Ambulatory Visit: Payer: Managed Care, Other (non HMO) | Admitting: Podiatry

## 2021-02-08 ENCOUNTER — Other Ambulatory Visit: Payer: Self-pay | Admitting: Podiatry

## 2021-02-08 NOTE — Telephone Encounter (Signed)
Please advise 

## 2021-03-20 ENCOUNTER — Other Ambulatory Visit: Payer: Self-pay

## 2021-03-20 ENCOUNTER — Other Ambulatory Visit: Payer: Managed Care, Other (non HMO)

## 2021-03-20 DIAGNOSIS — Z87898 Personal history of other specified conditions: Secondary | ICD-10-CM

## 2021-03-21 ENCOUNTER — Other Ambulatory Visit: Payer: Self-pay

## 2021-03-21 LAB — PSA: Prostate Specific Ag, Serum: 3.7 ng/mL (ref 0.0–4.0)

## 2021-03-23 ENCOUNTER — Encounter: Payer: Self-pay | Admitting: Urology

## 2021-03-23 ENCOUNTER — Other Ambulatory Visit: Payer: Self-pay

## 2021-03-23 ENCOUNTER — Ambulatory Visit: Payer: Managed Care, Other (non HMO) | Admitting: Urology

## 2021-03-23 VITALS — BP 142/81 | HR 76 | Ht 67.0 in | Wt 249.0 lb

## 2021-03-23 DIAGNOSIS — N400 Enlarged prostate without lower urinary tract symptoms: Secondary | ICD-10-CM | POA: Diagnosis not present

## 2021-03-23 DIAGNOSIS — Z87898 Personal history of other specified conditions: Secondary | ICD-10-CM

## 2021-03-23 NOTE — Progress Notes (Signed)
   03/23/2021 10:54 AM   Joel Andrews 1961-08-14 [id number]  Referring provider: Juluis Pitch, MD [phone] [address],  [place] [address]  Chief Complaint  Patient presents with   Elevated PSA    Urologic History 1. Elevated PSA           - Prostate Biopsy (01/2016); PSA 5.6; 38 cc prostate volume; benign - MRI (06/04/2016) showed no suspicious lesions  HPI: 60 y.o. male presents for annual follow-up.  No complaints since last years visit Denies bothersome voiding symptoms No dysuria, gross hematuria Denies flank, abdominal or pelvic pain PSA 03/20/2021 stable at 3.7   PMH: Past Medical History:  Diagnosis Date   Heart murmur     Surgical History: Past Surgical History:  Procedure Laterality Date   AV FISTULA PLACEMENT  2005   HEMORRHOID SURGERY  2005   KIDNEY SURGERY  2004    Home Medications:  Allergies as of 03/23/2021   No Known Allergies      Medication List        Accurate as of March 23, 2021 10:54 AM. If you have any questions, ask your nurse or doctor.          STOP taking these medications    methylPREDNISolone 4 MG Tbpk tablet Commonly known as: MEDROL DOSEPAK Stopped by: Abbie Sons, MD   Multi-Vitamin tablet Stopped by: Abbie Sons, MD       TAKE these medications    cetirizine 10 MG tablet Commonly known as: ZYRTEC Take by mouth.   Cholecalciferol 25 MCG (1000 UT) tablet Take by mouth.   Fish Oil 1000 MG Caps Take by mouth.   Flaxseed Oil 1000 MG Caps Take by mouth.   ibuprofen 800 MG tablet Commonly known as: ADVIL TK 1 T PO TID WF   lisinopril 40 MG tablet Commonly known as: ZESTRIL Take by mouth.   meloxicam 15 MG tablet Commonly known as: MOBIC TAKE 1 TABLET(15 MG) BY MOUTH DAILY   omeprazole 40 MG capsule Commonly known as: PRILOSEC Take by mouth.   vitamin B-12 1000 MCG tablet Commonly known as: CYANOCOBALAMIN Take by mouth.   zinc gluconate 50 MG tablet Take by mouth.         Allergies: No Known Allergies  Family History: Family History  Problem Relation Age of Onset   Prostate cancer Neg Hx    Bladder Cancer Neg Hx    Kidney cancer Neg Hx     Social History:  reports that he has never smoked. He has never used smokeless tobacco. He reports that he does not drink alcohol and does not use drugs.   Physical Exam: BP (!) 142/81   Pulse 76   Ht 5\' 7"  (1.702 m)   Wt 249 lb (112.9 kg)   BMI 39.00 kg/m   Constitutional:  Alert and oriented, No acute distress. HEENT: [place] AT, moist mucus membranes.  Trachea midline, no masses. Cardiovascular: No clubbing, cyanosis, or edema. Respiratory: Normal respiratory effort, no increased work of breathing. GU: Prostate 35 g, smooth without nodules Skin: No rashes, bruises or suspicious lesions. Neurologic: Grossly intact, no focal deficits, moving all 4 extremities. Psychiatric: Normal mood and affect.   Assessment & Plan:    1.  History elevated PSA PSA remains stable Benign DRE He desires to continue annual follow-up   Abbie Sons, MD  [facility] [address], [place], [place] [address] [phone]

## 2021-05-31 DIAGNOSIS — R519 Headache, unspecified: Secondary | ICD-10-CM | POA: Insufficient documentation

## 2021-09-18 ENCOUNTER — Encounter: Payer: Self-pay | Admitting: Urology

## 2021-11-14 ENCOUNTER — Ambulatory Visit: Payer: Managed Care, Other (non HMO) | Admitting: Podiatry

## 2021-11-21 ENCOUNTER — Encounter: Payer: Self-pay | Admitting: Podiatry

## 2021-11-21 ENCOUNTER — Other Ambulatory Visit: Payer: Self-pay

## 2021-11-21 ENCOUNTER — Ambulatory Visit (INDEPENDENT_AMBULATORY_CARE_PROVIDER_SITE_OTHER): Payer: Managed Care, Other (non HMO)

## 2021-11-21 ENCOUNTER — Ambulatory Visit: Payer: Managed Care, Other (non HMO) | Admitting: Podiatry

## 2021-11-21 DIAGNOSIS — M778 Other enthesopathies, not elsewhere classified: Secondary | ICD-10-CM | POA: Diagnosis not present

## 2021-11-21 MED ORDER — METHYLPREDNISOLONE 4 MG PO TBPK: ORAL_TABLET | ORAL | 0 refills | Status: DC

## 2021-11-21 NOTE — Progress Notes (Signed)
He presents after having not seen him for couple years with a chief concern of his second toe of his left foot swollen and tingling.  He states that he had a period where he was squatting a lot and then after that he could barely walk on the toe the next day.  He denies any trauma to the toe and denies any history of rashes or any arthritides.  Objective: Vital signs are stable he is alert and oriented x3 has mild mallet hammertoe deformity of the lesser toes and particularly the second toe left foot.  Radiographs taken today do demonstrate what appears to be a fuzzy possible fracture or impaction of the distal cortex of the middle phalanx of that second toe.  Can also cannot rule out changes from a psoriatic process or rheumatologic process.  If there is a toe is swollen moderately tender on medial lateral compression at the DIPJ.  Assessment: Cannot rule out a seropositive arthropathy or fracture to the distal aspect of the middle phalanx at the DIPJ.  Plan: Start him on a Medrol Dosepak and also send him for a arthritic profile consisting of a CBC as well as a HLA-B 27.

## 2021-11-30 NOTE — Progress Notes (Signed)
Patient has been notified, verbalized understanding.

## 2021-12-01 LAB — CBC WITH DIFFERENTIAL/PLATELET
Basophils Absolute: 0 10*3/uL (ref 0.0–0.2)
Basos: 0 %
EOS (ABSOLUTE): 0.3 10*3/uL (ref 0.0–0.4)
Eos: 4 %
Hematocrit: 44.5 % (ref 37.5–51.0)
Hemoglobin: 15 g/dL (ref 13.0–17.7)
Immature Grans (Abs): 0 10*3/uL (ref 0.0–0.1)
Immature Granulocytes: 0 %
Lymphocytes Absolute: 2.1 10*3/uL (ref 0.7–3.1)
Lymphs: 32 %
MCH: 29.9 pg (ref 26.6–33.0)
MCHC: 33.7 g/dL (ref 31.5–35.7)
MCV: 89 fL (ref 79–97)
Monocytes Absolute: 0.7 10*3/uL (ref 0.1–0.9)
Monocytes: 10 %
Neutrophils Absolute: 3.7 10*3/uL (ref 1.4–7.0)
Neutrophils: 54 %
Platelets: 181 10*3/uL (ref 150–450)
RBC: 5.02 x10E6/uL (ref 4.14–5.80)
RDW: 12.2 % (ref 11.6–15.4)
WBC: 6.8 10*3/uL (ref 3.4–10.8)

## 2021-12-01 LAB — URIC ACID: Uric Acid: 7.9 mg/dL (ref 3.8–8.4)

## 2021-12-01 LAB — SEDIMENTATION RATE: Sed Rate: 2 mm/hr (ref 0–30)

## 2021-12-01 LAB — C-REACTIVE PROTEIN: CRP: 8 mg/L (ref 0–10)

## 2021-12-01 LAB — HLA-B27 ANTIGEN: HLA B27: NEGATIVE

## 2021-12-01 LAB — RHEUMATOID FACTOR: Rheumatoid fact SerPl-aCnc: <10 IU/mL (ref <14.0)

## 2021-12-01 LAB — ANA: Anti Nuclear Antibody (ANA): NEGATIVE

## 2021-12-19 ENCOUNTER — Encounter: Payer: Self-pay | Admitting: Podiatry

## 2021-12-19 ENCOUNTER — Ambulatory Visit: Payer: Managed Care, Other (non HMO) | Admitting: Podiatry

## 2021-12-19 ENCOUNTER — Other Ambulatory Visit: Payer: Self-pay

## 2021-12-19 DIAGNOSIS — M7752 Other enthesopathy of left foot: Secondary | ICD-10-CM | POA: Diagnosis not present

## 2021-12-19 MED ORDER — DEXAMETHASONE SODIUM PHOSPHATE 120 MG/30ML IJ SOLN
2.0000 mg | Freq: Once | INTRAMUSCULAR | Status: AC
  Administered 2021-12-19: 2 mg via INTRA_ARTICULAR

## 2021-12-19 NOTE — Progress Notes (Signed)
Stanislaw presents today for follow-up of his capsulitis second toe DIPJ left foot.  States that it is some better some days and other days it is worse he states that is still sore and red for the most part.  States that when he took the methylprednisolone he noticed a decrease in the swelling and redness he also noticed that the tingling was not as bad.  But he states that he has come back since.  He does relate taking levofloxacin at this point secondary to otitis media of his right ear. ? ?Objective: Vital signs are stable he is alert and oriented x3 pulses are palpable.  Swollen red DIPJ right foot.  Previous blood work does not demonstrate any type of seropositive arthropathies.  He has good range of motion at the DIPJ though is somewhat contracted in a plantarflexed 3 manner. ? ?Assessment: Capsulitis osteoarthritis second DIPJ left foot. ? ?Plan: Discussed etiology pathology and surgical therapies this point I injected 2 mg of dexamethasone around the joint.  He tolerated procedure well and I will follow-up with him in about 3 weeks.  May be another set of x-rays and MRI if he has not improved. ?

## 2021-12-25 DIAGNOSIS — H6501 Acute serous otitis media, right ear: Secondary | ICD-10-CM | POA: Insufficient documentation

## 2022-01-09 ENCOUNTER — Ambulatory Visit: Payer: Managed Care, Other (non HMO) | Admitting: Podiatry

## 2022-03-12 ENCOUNTER — Other Ambulatory Visit: Payer: Managed Care, Other (non HMO)

## 2022-03-12 DIAGNOSIS — Z9622 Myringotomy tube(s) status: Secondary | ICD-10-CM | POA: Insufficient documentation

## 2022-03-12 DIAGNOSIS — Z87898 Personal history of other specified conditions: Secondary | ICD-10-CM

## 2022-03-12 DIAGNOSIS — N400 Enlarged prostate without lower urinary tract symptoms: Secondary | ICD-10-CM

## 2022-03-13 LAB — PSA: Prostate Specific Ag, Serum: 2.6 ng/mL (ref 0.0–4.0)

## 2022-03-20 ENCOUNTER — Ambulatory Visit: Payer: Managed Care, Other (non HMO) | Admitting: Urology

## 2022-03-20 ENCOUNTER — Encounter: Payer: Self-pay | Admitting: Urology

## 2022-03-20 VITALS — BP 142/72 | HR 80 | Ht 67.0 in | Wt 244.0 lb

## 2022-03-20 DIAGNOSIS — Z87898 Personal history of other specified conditions: Secondary | ICD-10-CM

## 2022-03-20 DIAGNOSIS — R972 Elevated prostate specific antigen [PSA]: Secondary | ICD-10-CM | POA: Diagnosis not present

## 2022-03-20 DIAGNOSIS — N400 Enlarged prostate without lower urinary tract symptoms: Secondary | ICD-10-CM

## 2022-03-20 NOTE — Progress Notes (Signed)
   03/20/2022 11:27 AM   Joel Andrews 08-23-1961 [id number]  Referring provider: Juluis Pitch, MD [phone] [address] [place],  [place] [address]  Chief Complaint  Patient presents with   Benign Prostatic Hypertrophy    1 year follow up    Urologic History 1. Elevated PSA           - Prostate Biopsy (01/2016); PSA 5.6; 38 cc prostate volume; benign - MRI (06/04/2016) showed no suspicious lesions  HPI: 61 y.o. male presents for annual follow-up.  No complaints since last years visit Denies bothersome voiding symptoms No dysuria, gross hematuria Denies flank, abdominal or pelvic pain PSA 03/12/2022 stable at 2.6   PMH: Past Medical History:  Diagnosis Date   Heart murmur     Surgical History: Past Surgical History:  Procedure Laterality Date   AV FISTULA PLACEMENT  2005   HEMORRHOID SURGERY  2005   KIDNEY SURGERY  2004    Home Medications:  Allergies as of 03/20/2022   No Known Allergies      Medication List        Accurate as of March 20, 2022 11:27 AM. If you have any questions, ask your nurse or doctor.          cetirizine 10 MG tablet Commonly known as: ZYRTEC Take by mouth.   Cholecalciferol 25 MCG (1000 UT) tablet Take by mouth.   Fish Oil 1000 MG Caps Take by mouth.   Flaxseed Oil 1000 MG Caps Take by mouth.   hydrOXYzine 10 MG tablet Commonly known as: ATARAX SMARTSIG:1-3 Tablet(s) By Mouth Every 4-6 Hours PRN   ibuprofen 800 MG tablet Commonly known as: ADVIL TK 1 T PO TID WF   lisinopril 40 MG tablet Commonly known as: ZESTRIL Take by mouth.   meloxicam 15 MG tablet Commonly known as: MOBIC Take 15 mg by mouth daily.   omeprazole 40 MG capsule Commonly known as: PRILOSEC Take by mouth.   vitamin B-12 1000 MCG tablet Commonly known as: CYANOCOBALAMIN Take by mouth.   zinc gluconate 50 MG tablet Take by mouth.        Allergies: No Known Allergies  Family History: Family History  Problem Relation Age of Onset    Prostate cancer Neg Hx    Bladder Cancer Neg Hx    Kidney cancer Neg Hx     Social History:  reports that he has never smoked. He has never used smokeless tobacco. He reports that he does not drink alcohol and does not use drugs.   Physical Exam: BP (!) 142/72   Pulse 80   Ht '5\' 7"'$  (1.702 m)   Wt 244 lb (110.7 kg)   BMI 38.22 kg/m   Constitutional:  Alert and oriented, No acute distress. HEENT: [place] AT Respiratory: Normal respiratory effort, no increased work of breathing. GU: Prostate 35 g, smooth without nodules Skin: No rashes, bruises or suspicious lesions. Neurologic: Grossly intact, no focal deficits, moving all 4 extremities. Psychiatric: Normal mood and affect.   Assessment & Plan:    1.  History elevated PSA PSA lower Benign DRE He desires to continue annual follow-up   Abbie Sons, MD  [facility] [address], [place],  [address] [phone]

## 2022-03-21 ENCOUNTER — Other Ambulatory Visit: Payer: Self-pay

## 2022-03-25 ENCOUNTER — Ambulatory Visit: Payer: Self-pay | Admitting: Urology

## 2022-03-27 ENCOUNTER — Ambulatory Visit: Payer: Managed Care, Other (non HMO) | Admitting: Urology

## 2022-03-28 ENCOUNTER — Ambulatory Visit: Payer: Managed Care, Other (non HMO) | Admitting: Urology

## 2022-03-29 ENCOUNTER — Ambulatory Visit: Payer: Managed Care, Other (non HMO) | Admitting: Urology

## 2022-04-26 ENCOUNTER — Other Ambulatory Visit: Payer: Self-pay | Admitting: Podiatry

## 2023-03-13 ENCOUNTER — Other Ambulatory Visit: Payer: Managed Care, Other (non HMO)

## 2023-03-17 ENCOUNTER — Other Ambulatory Visit: Payer: Managed Care, Other (non HMO)

## 2023-03-21 ENCOUNTER — Ambulatory Visit: Payer: Managed Care, Other (non HMO) | Admitting: Urology

## 2023-04-14 ENCOUNTER — Other Ambulatory Visit: Payer: Managed Care, Other (non HMO)

## 2023-04-14 ENCOUNTER — Other Ambulatory Visit: Payer: Self-pay | Admitting: *Deleted

## 2023-04-14 DIAGNOSIS — N400 Enlarged prostate without lower urinary tract symptoms: Secondary | ICD-10-CM

## 2023-04-14 DIAGNOSIS — Z87898 Personal history of other specified conditions: Secondary | ICD-10-CM

## 2023-04-15 LAB — PSA: Prostate Specific Ag, Serum: 3.6 ng/mL (ref 0.0–4.0)

## 2023-04-17 ENCOUNTER — Encounter: Payer: Self-pay | Admitting: Urology

## 2023-04-17 ENCOUNTER — Ambulatory Visit (INDEPENDENT_AMBULATORY_CARE_PROVIDER_SITE_OTHER): Payer: Managed Care, Other (non HMO) | Admitting: Urology

## 2023-04-17 VITALS — BP 154/71 | HR 73 | Ht 67.0 in | Wt 246.0 lb

## 2023-04-17 DIAGNOSIS — R972 Elevated prostate specific antigen [PSA]: Secondary | ICD-10-CM | POA: Diagnosis not present

## 2023-04-17 DIAGNOSIS — Z87898 Personal history of other specified conditions: Secondary | ICD-10-CM

## 2023-04-17 NOTE — Progress Notes (Signed)
    I, Maysun L Gibbs,acting as a scribe for Riki Altes, MD.,have documented all relevant documentation on the behalf of Riki Altes, MD,as directed by  Riki Altes, MD while in the presence of Riki Altes, MD.  04/17/2023 12:35 PM   Joel Andrews 28-Jul-1961 [id number]  Referring provider: Dorothey Baseman, MD [phone] [address] [place],  [place] [address]  Chief Complaint  Patient presents with   Elevated PSA   Urologic History 1. Elevated PSA           Prostate Biopsy (01/2016); PSA 5.6; 38 cc prostate volume; benign MRI (06/04/2016) showed no suspicious lesions  HPI: Joel Andrews is a 62 y.o. male presents for annual follow-up.   No complaints since last years visit Denies bothersome voiding symptoms No dysuria, gross hematuria Denies flank, abdominal or pelvic pain PSA 04/14/23 3.6   PMH: Past Medical History:  Diagnosis Date   Heart murmur     Surgical History: Past Surgical History:  Procedure Laterality Date   AV FISTULA PLACEMENT  2005   HEMORRHOID SURGERY  2005   KIDNEY SURGERY  2004    Home Medications:  Allergies as of 04/17/2023   No Known Allergies      Medication List        Accurate as of April 17, 2023 12:35 PM. If you have any questions, ask your nurse or doctor.          cetirizine 10 MG tablet Commonly known as: ZYRTEC Take by mouth.   Cholecalciferol 25 MCG (1000 UT) tablet Take by mouth.   cyanocobalamin 1000 MCG tablet Commonly known as: VITAMIN B12 Take by mouth.   Fish Oil 1000 MG Caps Take by mouth.   Flaxseed Oil 1000 MG Caps Take by mouth.   hydrOXYzine 10 MG tablet Commonly known as: ATARAX SMARTSIG:1-3 Tablet(s) By Mouth Every 4-6 Hours PRN   ibuprofen 800 MG tablet Commonly known as: ADVIL TK 1 T PO TID WF   lisinopril 40 MG tablet Commonly known as: ZESTRIL Take by mouth.   meloxicam 15 MG tablet Commonly known as: MOBIC Take 15 mg by mouth daily.   omeprazole 40 MG capsule Commonly known  as: PRILOSEC Take by mouth.   zinc gluconate 50 MG tablet Take by mouth.        Allergies: No Known Allergies  Family History: Family History  Problem Relation Age of Onset   Prostate cancer Neg Hx    Bladder Cancer Neg Hx    Kidney cancer Neg Hx     Social History:  reports that he has never smoked. He has never used smokeless tobacco. He reports that he does not drink alcohol and does not use drugs.   Physical Exam: BP (!) 154/71   Pulse 73   Ht 5\' 7"  (1.702 m)   Wt 246 lb (111.6 kg)   BMI 38.53 kg/m   Constitutional:  Alert and oriented, No acute distress. HEENT: [place] AT Respiratory: Normal respiratory effort, no increased work of breathing. GU: Prostate 35 g, smooth without nodules Psychiatric: Normal mood and affect.   Assessment & Plan:    1.  History elevated PSA PSA in normal range and stable Benign DRE He desires to continue annual follow-up  I have reviewed the above documentation for accuracy and completeness, and I agree with the above.   Riki Altes, MD  [facility] [address], [address] [place], [place] [address] [phone]

## 2023-05-23 DIAGNOSIS — R040 Epistaxis: Secondary | ICD-10-CM | POA: Insufficient documentation

## 2024-02-23 ENCOUNTER — Ambulatory Visit: Admitting: Podiatry

## 2024-02-23 ENCOUNTER — Other Ambulatory Visit: Payer: Self-pay | Admitting: Podiatry

## 2024-02-23 ENCOUNTER — Encounter: Payer: Self-pay | Admitting: Podiatry

## 2024-02-23 ENCOUNTER — Ambulatory Visit (INDEPENDENT_AMBULATORY_CARE_PROVIDER_SITE_OTHER)

## 2024-02-23 DIAGNOSIS — M7751 Other enthesopathy of right foot: Secondary | ICD-10-CM

## 2024-02-23 DIAGNOSIS — G5791 Unspecified mononeuropathy of right lower limb: Secondary | ICD-10-CM

## 2024-02-23 MED ORDER — TRIAMCINOLONE ACETONIDE 40 MG/ML IJ SUSP
20.0000 mg | Freq: Once | INTRAMUSCULAR | Status: AC
  Administered 2024-02-23: 20 mg

## 2024-02-23 NOTE — Progress Notes (Signed)
 He presents today chief complaint of pain/numbness beneath his 2nd and 3rd metatarsophalangeal joints of his right foot.  States that been going on for about 3 weeks now.  He has tried Voltaren gel and received temporary relief with that.  Objective: Vital signs stable alert oriented x 3.  Pulses are palpable.  Relief with the gel is most likely an inflammatory process.  He does have pain on palpation and range of motion of the 2nd and 3rd metatarsal phalangeal joints.  There is numbness on the plantar aspect of the foot.  Radiographs taken today demonstrate osseously mature individual with good bone mineralization.  The 2nd and 3rd metatarsals do demonstrate a slightly larger protrusion angle than normal.  More than likely he is bearing more weight on that 2nd and 3rd metatarsal resulting in a compensatory type neuritis.  Assessment: Neuritis capsulitis second third metatarsophalangeal joints of his right foot.  Plan: Discussed etiology pathology conservative surgical therapies at this point I injected around the 2nd and 3rd metatarsal phalangeal joints using 10 mg of Kenalog  and 5 mg of Marcaine.  He tolerated procedure well.  Discussed appropriate shoe gear.  Follow-up with him on an as-needed basis

## 2024-03-09 ENCOUNTER — Telehealth: Payer: Self-pay | Admitting: Podiatry

## 2024-03-09 ENCOUNTER — Other Ambulatory Visit: Payer: Self-pay

## 2024-03-09 MED ORDER — METHYLPREDNISOLONE 4 MG PO TBPK: ORAL_TABLET | ORAL | 0 refills | Status: AC

## 2024-03-09 NOTE — Telephone Encounter (Signed)
 Still having the issue with right foot toe 5 /19. Patient is going out of town starting June 7th for one month. Does he need to make an appointment ? Or can a medication be prescribed to help with sypmtoms.Preferred [facility] [phone]

## 2024-03-24 ENCOUNTER — Ambulatory Visit: Admitting: Podiatry

## 2024-04-12 ENCOUNTER — Other Ambulatory Visit: Payer: Self-pay

## 2024-04-15 ENCOUNTER — Ambulatory Visit: Payer: Self-pay | Admitting: Urology

## 2024-04-19 ENCOUNTER — Other Ambulatory Visit: Payer: Self-pay

## 2024-04-19 ENCOUNTER — Encounter: Payer: Self-pay | Admitting: Podiatry

## 2024-04-19 ENCOUNTER — Ambulatory Visit: Admitting: Podiatry

## 2024-04-19 DIAGNOSIS — G5761 Lesion of plantar nerve, right lower limb: Secondary | ICD-10-CM | POA: Diagnosis not present

## 2024-04-19 DIAGNOSIS — G5791 Unspecified mononeuropathy of right lower limb: Secondary | ICD-10-CM

## 2024-04-19 DIAGNOSIS — N400 Enlarged prostate without lower urinary tract symptoms: Secondary | ICD-10-CM

## 2024-04-19 NOTE — Progress Notes (Signed)
 Presents today for follow-up of his pain around the second toe.  He states the injections really helped relieve the symptoms.  Objective: Vital signs stable alert oriented x 3 still has some allodynic type symptoms around the medial aspect of the second digit of the right foot no symptoms on the lateral aspect of the left hallux however.  It appears that it may be generating from the lateral branch of the deep peroneal nerve.  No tenderness on range of motion of the 2nd or 3rd toe at the metatarsal phalangeal joints.  Assessment: Pain in limb with neuritis second digit right foot.  Plan: I reinjected the area today with Kenalog  and local anesthetic 5 mg was utilized with local anesthetic 5 mg also.  Follow-up with me on as-needed basis.

## 2024-04-20 ENCOUNTER — Other Ambulatory Visit: Payer: Self-pay

## 2024-04-20 DIAGNOSIS — N400 Enlarged prostate without lower urinary tract symptoms: Secondary | ICD-10-CM

## 2024-04-21 LAB — PSA: Prostate Specific Ag, Serum: 4.9 ng/mL — ABNORMAL HIGH (ref 0.0–4.0)

## 2024-04-22 DIAGNOSIS — G5761 Lesion of plantar nerve, right lower limb: Secondary | ICD-10-CM

## 2024-04-22 MED ORDER — TRIAMCINOLONE ACETONIDE 40 MG/ML IJ SUSP
20.0000 mg | Freq: Once | INTRAMUSCULAR | Status: AC
  Administered 2024-04-22: 20 mg

## 2024-04-22 NOTE — Addendum Note (Signed)
 Addended by: ELAYNE KNEE E on: 04/22/2024 07:05 PM   Modules accepted: Orders

## 2024-04-28 ENCOUNTER — Ambulatory Visit: Payer: Self-pay | Admitting: Urology

## 2024-04-28 ENCOUNTER — Encounter: Payer: Self-pay | Admitting: Urology

## 2024-04-28 VITALS — BP 137/87 | HR 76 | Ht 67.0 in | Wt 240.0 lb

## 2024-04-28 DIAGNOSIS — R972 Elevated prostate specific antigen [PSA]: Secondary | ICD-10-CM | POA: Diagnosis not present

## 2024-04-28 NOTE — Progress Notes (Signed)
    04/28/2024 1:57 PM   Joel Andrews [birth year] [id number]  Referring provider: Glover Lenis, MD [phone] [place],  [place] [address]  Chief Complaint  Patient presents with   Elevated PSA   Urologic History 1. Elevated PSA           Prostate Biopsy (01/2016); PSA 5.6; 38 cc prostate volume; benign MRI (06/04/2016) showed no suspicious lesions  HPI: Joel Andrews is a 63 y.o. male presents for annual follow-up.   No complaints since last years visit Denies bothersome voiding symptoms No dysuria, gross hematuria Denies flank, abdominal or pelvic pain PSA 04/20/2024 was 4.9   PMH: Past Medical History:  Diagnosis Date   Heart murmur     Surgical History: Past Surgical History:  Procedure Laterality Date   AV FISTULA PLACEMENT  2005   HEMORRHOID SURGERY  2005   KIDNEY SURGERY  2004    Home Medications:  Allergies as of 04/28/2024   No Known Allergies      Medication List        Accurate as of April 28, 2024  1:57 PM. If you have any questions, ask your nurse or doctor.          cetirizine 10 MG tablet Commonly known as: ZYRTEC Take by mouth.   Cholecalciferol 25 MCG (1000 UT) tablet Take by mouth.   cyanocobalamin 1000 MCG tablet Commonly known as: VITAMIN B12 Take by mouth.   Fish Oil 1000 MG Caps Take by mouth.   lisinopril 40 MG tablet Commonly known as: ZESTRIL Take by mouth.   omeprazole 40 MG capsule Commonly known as: PRILOSEC Take by mouth.   VITAMIN C PO Take by mouth.   zinc gluconate 50 MG tablet Take by mouth.        Allergies: No Known Allergies  Family History: Family History  Problem Relation Age of Onset   Prostate cancer Neg Hx    Bladder Cancer Neg Hx    Kidney cancer Neg Hx     Social History:  reports that he has never smoked. He has never used smokeless tobacco. He reports that he does not drink alcohol and does not use drugs.   Physical Exam: BP 137/87   Pulse 76   Ht 5' 7 (1.702 m)   Wt 240  lb (108.9 kg)   BMI 37.59 kg/m   Constitutional:  Alert and oriented, No acute distress. HEENT: [place] AT Respiratory: Normal respiratory effort, no increased work of breathing. GU: Prostate 35 g, smooth without nodules Psychiatric: Normal mood and affect.   Assessment & Plan:    1.  Elevated PSA Most recent PSA elevated at 4.9.  Baseline PSA has been in the mid-upper 3 range since 2020. Current PSA lower than previous biopsy in 2017 Repeat PSA 6 weeks and if it remains elevated above most recent baseline recommend repeat prostate MRI  Joel JAYSON Barba, MD  [facility] [address] [place], [place] [address] [phone]

## 2024-05-11 ENCOUNTER — Telehealth: Payer: Self-pay | Admitting: Podiatry

## 2024-05-11 NOTE — Telephone Encounter (Signed)
 Patient calling stating he is till having issues with his right foot. Patient wants to know what should he do now since he is still having issues.

## 2024-05-12 ENCOUNTER — Other Ambulatory Visit: Payer: Self-pay | Admitting: Podiatry

## 2024-05-12 MED ORDER — GABAPENTIN 300 MG PO CAPS: 300.0000 mg | ORAL_CAPSULE | Freq: Three times a day (TID) | ORAL | 3 refills | Status: AC

## 2024-05-13 NOTE — Telephone Encounter (Signed)
 Spoke to patient informed medication called in if not getting better schedule to be seen in office.

## 2024-05-20 ENCOUNTER — Ambulatory Visit: Payer: Self-pay

## 2024-05-20 DIAGNOSIS — Z09 Encounter for follow-up examination after completed treatment for conditions other than malignant neoplasm: Secondary | ICD-10-CM | POA: Diagnosis present

## 2024-05-20 DIAGNOSIS — D120 Benign neoplasm of cecum: Secondary | ICD-10-CM | POA: Diagnosis not present

## 2024-05-20 DIAGNOSIS — K640 First degree hemorrhoids: Secondary | ICD-10-CM | POA: Diagnosis not present

## 2024-05-20 DIAGNOSIS — C7A012 Malignant carcinoid tumor of the ileum: Secondary | ICD-10-CM | POA: Diagnosis not present

## 2024-05-20 DIAGNOSIS — Z860100 Personal history of colon polyps, unspecified: Secondary | ICD-10-CM | POA: Diagnosis not present

## 2024-05-20 DIAGNOSIS — D126 Benign neoplasm of colon, unspecified: Secondary | ICD-10-CM | POA: Diagnosis not present

## 2024-05-26 ENCOUNTER — Other Ambulatory Visit: Payer: Self-pay | Admitting: Gastroenterology

## 2024-05-26 DIAGNOSIS — K639 Disease of intestine, unspecified: Secondary | ICD-10-CM

## 2024-06-01 ENCOUNTER — Ambulatory Visit
Admission: RE | Admit: 2024-06-01 | Discharge: 2024-06-01 | Disposition: A | Discharge status: Home / Self Care | Source: Ambulatory Visit | Attending: Gastroenterology | Admitting: Gastroenterology

## 2024-06-01 DIAGNOSIS — K639 Disease of intestine, unspecified: Secondary | ICD-10-CM | POA: Insufficient documentation

## 2024-06-01 LAB — POCT I-STAT CREATININE: Creatinine, Ser: 1.4 mg/dL — ABNORMAL HIGH (ref 0.61–1.24)

## 2024-06-01 MED ORDER — IOHEXOL 300 MG/ML  SOLN
100.0000 mL | Freq: Once | INTRAMUSCULAR | Status: AC | PRN
  Administered 2024-06-01: 100 mL via INTRAVENOUS

## 2024-06-29 ENCOUNTER — Other Ambulatory Visit

## 2024-07-02 ENCOUNTER — Other Ambulatory Visit

## 2024-07-09 ENCOUNTER — Telehealth: Payer: Self-pay | Admitting: Urology

## 2024-07-09 NOTE — Telephone Encounter (Signed)
 Patient called stating he called on 06/28/24 regarding his PSA results from the labs drawn from his PCP. Patient states he spoke with someone from urology regarding this and was told that Dr. Twylla will review and he would get a call back. However, patient has not heard back from our office since. Please advice.

## 2024-07-12 NOTE — Telephone Encounter (Signed)
 I do not see that we received any PSA results from PCP. Please check

## 2024-07-14 NOTE — Telephone Encounter (Signed)
 Based on repeat PSA which is lower recommend continued monitoring.  Follow-up office visit 6 months with PSA.

## 2024-07-15 NOTE — Telephone Encounter (Signed)
Left message to return the call

## 2024-07-15 NOTE — Telephone Encounter (Signed)
 Pt called back, I scheduled a 6 month follow up with PSA prior.

## 2025-01-06 ENCOUNTER — Other Ambulatory Visit

## 2025-01-11 ENCOUNTER — Other Ambulatory Visit

## 2025-01-17 ENCOUNTER — Ambulatory Visit: Admitting: Urology
